# Patient Record
Sex: Female | Born: 1960 | Race: White | Hispanic: No | Marital: Married | Smoking: Never smoker
Health system: Southern US, Community
[De-identification: ages and names within clinical notes are randomized; demographics above are authoritative.]

## PROBLEM LIST (undated history)

## (undated) DIAGNOSIS — I10 Essential (primary) hypertension: Secondary | ICD-10-CM

## (undated) DIAGNOSIS — C8590 Non-Hodgkin lymphoma, unspecified, unspecified site: Secondary | ICD-10-CM

## (undated) HISTORY — PX: SALIVARY GLAND SURGERY: SHX768

## (undated) HISTORY — DX: Non-Hodgkin lymphoma, unspecified, unspecified site: C85.90

## (undated) HISTORY — PX: THYROID SURGERY: SHX805

## (undated) HISTORY — PX: LIMBAL STEM CELL TRANSPLANT: SHX1969

## (undated) HISTORY — DX: Essential (primary) hypertension: I10

---

## 2013-03-31 DIAGNOSIS — C8330 Diffuse large B-cell lymphoma, unspecified site: Secondary | ICD-10-CM | POA: Insufficient documentation

## 2014-09-13 DIAGNOSIS — Z8669 Personal history of other diseases of the nervous system and sense organs: Secondary | ICD-10-CM | POA: Insufficient documentation

## 2016-01-12 DIAGNOSIS — Z9484 Stem cells transplant status: Secondary | ICD-10-CM | POA: Insufficient documentation

## 2017-01-11 DIAGNOSIS — Z9189 Other specified personal risk factors, not elsewhere classified: Secondary | ICD-10-CM | POA: Insufficient documentation

## 2018-03-18 ENCOUNTER — Telehealth: Payer: Self-pay | Admitting: *Deleted

## 2018-03-18 NOTE — Telephone Encounter (Signed)
Left patient a message to call and reschedule appt for 04/07/18 at 7:00am with Dr. Hulan Fray. Dr. Nehemiah Settle will be here in the AM and [facility] in the PM or she can wait to see Dr. Hulan Fray.

## 2018-03-25 ENCOUNTER — Other Ambulatory Visit: Payer: Self-pay | Admitting: Certified Nurse Midwife

## 2018-03-25 ENCOUNTER — Encounter: Payer: Self-pay | Admitting: Certified Nurse Midwife

## 2018-03-25 ENCOUNTER — Ambulatory Visit (INDEPENDENT_AMBULATORY_CARE_PROVIDER_SITE_OTHER): Payer: 59 | Admitting: Certified Nurse Midwife

## 2018-03-25 VITALS — BP 126/78 | HR 84 | Resp 16 | Ht 68.0 in | Wt 171.0 lb

## 2018-03-25 DIAGNOSIS — N898 Other specified noninflammatory disorders of vagina: Secondary | ICD-10-CM

## 2018-03-25 DIAGNOSIS — Z01419 Encounter for gynecological examination (general) (routine) without abnormal findings: Secondary | ICD-10-CM

## 2018-03-25 DIAGNOSIS — Z124 Encounter for screening for malignant neoplasm of cervix: Secondary | ICD-10-CM | POA: Diagnosis not present

## 2018-03-25 DIAGNOSIS — Z1231 Encounter for screening mammogram for malignant neoplasm of breast: Secondary | ICD-10-CM

## 2018-03-25 DIAGNOSIS — Z1151 Encounter for screening for human papillomavirus (HPV): Secondary | ICD-10-CM

## 2018-03-25 MED ORDER — TERCONAZOLE 0.4 % VA CREA: 1.0000 | TOPICAL_CREAM | Freq: Every day | VAGINAL | 0 refills | Status: DC

## 2018-03-26 ENCOUNTER — Emergency Department (INDEPENDENT_AMBULATORY_CARE_PROVIDER_SITE_OTHER): Payer: 59

## 2018-03-26 ENCOUNTER — Emergency Department (INDEPENDENT_AMBULATORY_CARE_PROVIDER_SITE_OTHER)
Admission: EM | Admit: 2018-03-26 | Discharge: 2018-03-26 | Disposition: A | Discharge status: Home / Self Care | Payer: 59 | Source: Home / Self Care | Attending: Family Medicine | Admitting: Family Medicine

## 2018-03-26 ENCOUNTER — Other Ambulatory Visit: Payer: Self-pay

## 2018-03-26 ENCOUNTER — Encounter: Payer: Self-pay | Admitting: *Deleted

## 2018-03-26 ENCOUNTER — Telehealth: Payer: Self-pay | Admitting: *Deleted

## 2018-03-26 DIAGNOSIS — R05 Cough: Secondary | ICD-10-CM

## 2018-03-26 DIAGNOSIS — R509 Fever, unspecified: Secondary | ICD-10-CM | POA: Diagnosis not present

## 2018-03-26 DIAGNOSIS — R69 Illness, unspecified: Secondary | ICD-10-CM

## 2018-03-26 DIAGNOSIS — J111 Influenza due to unidentified influenza virus with other respiratory manifestations: Secondary | ICD-10-CM

## 2018-03-26 LAB — POCT INFLUENZA A/B
INFLUENZA A, POC: NEGATIVE
INFLUENZA B, POC: NEGATIVE

## 2018-03-26 MED ORDER — OSELTAMIVIR PHOSPHATE 75 MG PO CAPS: 75.0000 mg | ORAL_CAPSULE | Freq: Two times a day (BID) | ORAL | 0 refills | Status: DC

## 2018-03-26 NOTE — ED Triage Notes (Signed)
Pt c/o sudden onset fever up to 102.1, cough, sneezing, and sore throat x 1 day. No OTC meds today.

## 2018-03-26 NOTE — Discharge Instructions (Addendum)
Take plain guaifenesin (1200mg  extended release tabs such as Mucinex) twice daily, with plenty of water, for cough and congestion. Get adequate rest.   May use Afrin nasal spray (or generic oxymetazoline) each morning for about 5 days and then discontinue.  Also recommend using saline nasal spray several times daily and saline nasal irrigation (AYR is a common brand).   Try warm salt water gargles for sore throat.  Stop all antihistamines for now, and other non-prescription cough/cold preparations. May take Delsym Cough Suppressant at bedtime for nighttime cough.  May take Ibuprofen 200mg , 4 tabs every 8 hours with food for fever, body aches, headache, etc.

## 2018-03-26 NOTE — ED Provider Notes (Signed)
[facility]    CSN: [id number] Arrival date & time: 03/26/18  0919     History   Chief Complaint Chief Complaint  Patient presents with  . Fever  . Cough    HPI Shelly Haley is a 57 y.o. female.   Patient began sneezing yesterday, followed by nasal congestion, fatigue, mild myalgias, non-productive cough and fever to 102.  This morning her fever was 101 and her symptoms persist. She has a distant past history of pneumonia.  She has history of lymphoma in remission.    The history is provided by the patient.    Past Medical History:  Diagnosis Date  . Hypertension   . Lymphoma ([place])   . Non-Hodgkin lymphoma ([place])     Active problems:  hypertension    Past Surgical History:  Procedure Laterality Date  . LIMBAL STEM CELL TRANSPLANT    . SALIVARY GLAND SURGERY    . THYROID SURGERY      OB History    Gravida  2   Para  2   Term  2   Preterm      AB      Living        SAB      TAB      Ectopic      Multiple      Live Births  2            Home Medications    Prior to Admission medications   Medication Sig Start Date End Date Taking? Authorizing Provider  cholecalciferol (VITAMIN D3) 25 MCG (1000 UT) tablet Take 4,000 Units by mouth daily.    [provider]  metoprolol tartrate (LOPRESSOR) 50 MG tablet Take 50 mg by mouth 2 (two) times daily.    [provider]  oseltamivir (TAMIFLU) 75 MG capsule Take 1 capsule (75 mg total) by mouth every 12 (twelve) hours. 03/26/18   Kandra Nicolas, MD  terconazole (TERAZOL 7) 0.4 % vaginal cream Place 1 applicator vaginally at bedtime. Use for seven days 03/25/18   Lajean Manes, CNM    Family History Family History  Problem Relation Age of Onset  . Kidney cancer Mother   . Esophageal cancer Father   . Heart disease Father     Social History Social History   Tobacco Use  . Smoking status: Never Smoker  . Smokeless tobacco: Never Used  Substance Use  Topics  . Alcohol use: Never    Frequency: Never  . Drug use: Never     Allergies   Patient has no known allergies.   Review of Systems Review of Systems + sore throat + cough + sneezing No pleuritic pain No wheezing + nasal congestion + post-nasal drainage No sinus pain/pressure No itchy/red eyes No earache No hemoptysis No SOB + fever, + chills No nausea No vomiting No abdominal pain No diarrhea No urinary symptoms No skin rash + fatigue + myalgias + headache Used OTC meds without relief   Physical Exam Triage Vital Signs ED Triage Vitals [03/26/18 1018]  Enc Vitals Group     BP (!) 143/80     Pulse Rate (!) 105     Resp 18     Temp 99.1 F (37.3 C)     Temp Source Oral     SpO2 97 %     Weight 166 lb (75.3 kg)     Height 5\' 8"  (1.727 m)     Head Circumference  Peak Flow      Pain Score 0     Pain Loc      Pain Edu?      Excl. in [place]?    No data found.  Updated Vital Signs BP (!) 143/80 (BP Location: Right Arm)   Pulse (!) 105   Temp 99.1 F (37.3 C) (Oral)   Resp 18   Ht 5\' 8"  (1.727 m)   Wt 75.3 kg   SpO2 97%   BMI 25.24 kg/m   Visual Acuity Right Eye Distance:   Left Eye Distance:   Bilateral Distance:    Right Eye Near:   Left Eye Near:    Bilateral Near:     Physical Exam Nursing notes and Vital Signs reviewed. Appearance:  Patient appears stated age, and in no acute distress Eyes:  Pupils are equal, round, and reactive to light and accomodation.  Extraocular movement is intact.  Conjunctivae are not inflamed  Ears:  Canals normal.  Tympanic membranes normal.  Nose:  Mildly congested turbinates.  No sinus tenderness.    Pharynx:  Normal Neck:  Supple.  Mildly enlarged lateral nodes, nontender   Lungs:  Clear to auscultation.  Breath sounds are equal.  Moving air well. Heart:  Regular rate and rhythm without murmurs, rubs, or gallops.  Abdomen:  Nontender without masses or hepatosplenomegaly.  Bowel sounds are present.   No CVA or flank tenderness.  Extremities:  No edema.  Skin:  No rash present.    UC Treatments / Results  Labs (all labs ordered are listed, but only abnormal results are displayed) Labs Reviewed - No data to display  EKG None  Radiology Dg Chest 2 View  Result Date: 03/26/2018 CLINICAL DATA:  Pt c/o flu like symptoms, cough, fever x 2 days, thyroid surgery, HTN, NHL, limbal stem cell transplant EXAM: CHEST - 2 VIEW COMPARISON:  None. FINDINGS: Cardiac silhouette is normal in size. No mediastinal hilar masses. No evidence of adenopathy in ill-defined nodular opacity peripheral left mid lung, which likely just above the oblique fissure in the left upper lobe on the lateral view. This measures approximately 1 cm size. Lungs are otherwise clear.  No pleural effusion pneumothorax. Skeletal structures are intact. IMPRESSION: 1. No acute cardiopulmonary disease. 2. Possible 1 cm left upper lobe nodule. Follow-up nonemergent chest CT recommended for further assessment. Electronically Signed   By: Lajean Manes M.D.   On: 03/26/2018 11:13    Procedures Procedures (including critical care time)  Medications Ordered in UC Medications - No data to display  Initial Impression / Assessment and Plan / UC Course  I have reviewed the triage vital signs and the nursing notes.  Pertinent labs & imaging results that were available during my care of the patient were reviewed by me and considered in my medical decision making (see chart for details).    ?false negative rapid flu test.  Will begin empiricTamiflu.  There is no evidence of bacterial infection today. Chest X-ray today shows 1cm left upper lobe nodule.  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 Recommend follow-up with PCP if not improving about 5 days.  Final Clinical Impressions(s) / UC Diagnoses   Final  diagnoses:  Influenza-like illness     Discharge Instructions     Take plain guaifenesin (1200mg  extended release tabs such as Mucinex) twice daily, with plenty of water,  for cough and congestion. Get adequate rest.   May use Afrin nasal spray (or generic oxymetazoline) each morning for about 5 days and then discontinue.  Also recommend using saline nasal spray several times daily and saline nasal irrigation (AYR is a common brand).   Try warm salt water gargles for sore throat.  Stop all antihistamines for now, and other non-prescription cough/cold preparations. May take Delsym Cough Suppressant at bedtime for nighttime cough.  May take Ibuprofen 200mg , 4 tabs every 8 hours with food for fever, body aches, headache, etc.        ED Prescriptions    Medication Sig Dispense Auth. Provider   oseltamivir (TAMIFLU) 75 MG capsule Take 1 capsule (75 mg total) by mouth every 12 (twelve) hours. 10 capsule Kandra Nicolas, MD         Kandra Nicolas, MD 03/26/18 1146

## 2018-03-26 NOTE — Telephone Encounter (Signed)
Encounter created to enter POCT Flu order and results not entered at visit.

## 2018-03-27 LAB — CYTOLOGY - PAP
Diagnosis: NEGATIVE
HPV: NOT DETECTED

## 2018-03-27 NOTE — Progress Notes (Signed)
GYNECOLOGY ANNUAL PREVENTATIVE CARE ENCOUNTER NOTE  Subjective:   Shelly Haley is a 57 y.o. G22P2000 female here for a routine annual gynecologic exam.  Current complaints: vaginal itching and discharge.   Denies abnormal vaginal bleeding, pelvic pain, problems with intercourse or other gynecologic concerns.    Gynecologic History No LMP recorded. Patient is postmenopausal. Contraception: none  Obstetric History OB History  Gravida Para Term Preterm AB Living  2 2 2         SAB TAB Ectopic Multiple Live Births          2    # Outcome Date GA Lbr Len/2nd Weight Sex Delivery Anes PTL Lv  2 Term      Vag-Spont     1 Term      Vag-Spont       Past Medical History:  Diagnosis Date  . Hypertension   . Lymphoma ([place])   . Non-Hodgkin lymphoma [facility])     Past Surgical History:  Procedure Laterality Date  . LIMBAL STEM CELL TRANSPLANT    . SALIVARY GLAND SURGERY    . THYROID SURGERY      Current Outpatient Medications on File Prior to Visit  Medication Sig Dispense Refill  . cholecalciferol (VITAMIN D3) 25 MCG (1000 UT) tablet Take 4,000 Units by mouth daily.    . metoprolol tartrate (LOPRESSOR) 50 MG tablet Take 50 mg by mouth 2 (two) times daily.     No current facility-administered medications on file prior to visit.     No Known Allergies  Social History:  reports that she has never smoked. She has never used smokeless tobacco. She reports that she does not drink alcohol or use drugs.  Family History  Problem Relation Age of Onset  . Kidney cancer Mother   . Esophageal cancer Father   . Heart disease Father     The following portions of the patient's history were reviewed and updated as appropriate: allergies, current medications, past family history, past medical history, past social history, past surgical history and problem list.  Review of Systems Pertinent items noted in HPI and remainder of comprehensive ROS otherwise negative.   Objective:  BP 126/78    Pulse 84   Resp 16   Ht 5\' 8"  (1.727 m)   Wt 171 lb (77.6 kg)   BMI 26.00 kg/m  CONSTITUTIONAL: Well-developed, well-nourished female in no acute distress.  HENT:  Normocephalic, atraumatic, External right and left ear normal. Oropharynx is clear and moist EYES: Conjunctivae and EOM are normal. Pupils are equal, round, and reactive to light. No scleral icterus.  NECK: Normal range of motion, supple, no masses.  Normal thyroid.  SKIN: Skin is warm and dry. No rash noted. Not diaphoretic. No erythema. No pallor. MUSCULOSKELETAL: Normal range of motion. No tenderness.  No cyanosis, clubbing, or edema.  2+ distal pulses. NEUROLOGIC: Alert and oriented to person, place, and time. Normal reflexes, muscle tone coordination. No cranial nerve deficit noted. PSYCHIATRIC: Normal mood and affect. Normal behavior. Normal judgment and thought content. CARDIOVASCULAR: Normal heart rate noted, regular rhythm RESPIRATORY: Clear to auscultation bilaterally. Effort and breath sounds normal, no problems with respiration noted. BREASTS: Symmetric in size. No masses, skin changes, nipple drainage, or lymphadenopathy. ABDOMEN: Soft, normal bowel sounds, no distention noted.  No tenderness, rebound or guarding.  PELVIC: Normal appearing external genitalia; normal appearing vaginal mucosa and cervix.  White curdy discharge noted around cervix.  Pap smear obtained.  Normal uterine size, no other palpable  masses, no uterine or adnexal tenderness.    Assessment and Plan:  1. Well woman exam - Normal well woman examination, referral placed for colonoscopy  - Cytology - PAP( [place])  2. Vaginal discharge - White curdy discharge with itching, Rx sent for yeast infection  - terconazole (TERAZOL 7) 0.4 % vaginal cream; Place 1 applicator vaginally at bedtime. Use for seven days  Dispense: 45 g; Refill: 0  3. Vaginal itching - terconazole (TERAZOL 7) 0.4 % vaginal cream; Place 1 applicator vaginally at  bedtime. Use for seven days  Dispense: 45 g; Refill: 0  Will follow up results of pap smear and manage accordingly. Mammogram scheduled Routine preventative health maintenance measures emphasized. Please refer to After Visit Summary for other counseling recommendations.    Lajean Manes, [place] for [employer], [place]

## 2018-03-28 ENCOUNTER — Telehealth: Payer: Self-pay | Admitting: *Deleted

## 2018-03-28 MED ORDER — FLUCONAZOLE 150 MG PO TABS: 150.0000 mg | ORAL_TABLET | Freq: Once | ORAL | 0 refills | Status: AC

## 2018-03-28 NOTE — Telephone Encounter (Signed)
Pt called stating that she would like Diflucan instead of the Terazol she was given.  RX for Diflucan sent to [facility].

## 2018-04-07 ENCOUNTER — Encounter: Payer: Self-pay | Admitting: Obstetrics & Gynecology

## 2018-04-08 ENCOUNTER — Encounter: Payer: Self-pay | Admitting: Advanced Practice Midwife

## 2018-04-09 ENCOUNTER — Ambulatory Visit: Payer: 59

## 2018-04-23 ENCOUNTER — Ambulatory Visit (INDEPENDENT_AMBULATORY_CARE_PROVIDER_SITE_OTHER): Payer: 59

## 2018-04-23 DIAGNOSIS — Z1231 Encounter for screening mammogram for malignant neoplasm of breast: Secondary | ICD-10-CM | POA: Diagnosis not present

## 2019-04-13 ENCOUNTER — Encounter

## 2019-06-15 ENCOUNTER — Other Ambulatory Visit: Payer: Self-pay | Admitting: Certified Nurse Midwife

## 2019-06-15 DIAGNOSIS — Z1231 Encounter for screening mammogram for malignant neoplasm of breast: Secondary | ICD-10-CM

## 2019-07-08 ENCOUNTER — Other Ambulatory Visit: Payer: Self-pay

## 2019-07-08 ENCOUNTER — Ambulatory Visit (INDEPENDENT_AMBULATORY_CARE_PROVIDER_SITE_OTHER): Payer: 59

## 2019-07-08 DIAGNOSIS — Z1231 Encounter for screening mammogram for malignant neoplasm of breast: Secondary | ICD-10-CM

## 2019-08-11 ENCOUNTER — Ambulatory Visit (INDEPENDENT_AMBULATORY_CARE_PROVIDER_SITE_OTHER): Payer: 59 | Admitting: Certified Nurse Midwife

## 2019-08-11 ENCOUNTER — Other Ambulatory Visit: Payer: Self-pay

## 2019-08-11 ENCOUNTER — Encounter: Payer: Self-pay | Admitting: Certified Nurse Midwife

## 2019-08-11 ENCOUNTER — Other Ambulatory Visit (HOSPITAL_COMMUNITY)
Admission: RE | Admit: 2019-08-11 | Discharge: 2019-08-11 | Disposition: A | Discharge status: Home / Self Care | Payer: 59 | Source: Ambulatory Visit | Attending: Certified Nurse Midwife | Admitting: Certified Nurse Midwife

## 2019-08-11 VITALS — BP 129/79 | HR 82 | Temp 98.0°F | Resp 16 | Ht 68.0 in | Wt 166.0 lb

## 2019-08-11 DIAGNOSIS — Z01419 Encounter for gynecological examination (general) (routine) without abnormal findings: Secondary | ICD-10-CM | POA: Insufficient documentation

## 2019-08-11 NOTE — Progress Notes (Signed)
GYNECOLOGY ANNUAL PREVENTATIVE CARE ENCOUNTER NOTE  History:     Shelly Haley is a 59 y.o. G88P2000 female here for a routine annual gynecologic exam.  Current complaints: vaginal dryness and irritation.   Denies abnormal vaginal bleeding, discharge, pelvic pain, or other gynecologic concerns. patient request pap smear today.    Gynecologic History No LMP recorded. Patient is postmenopausal. Contraception: none Last Pap: 03/2018. Results were: normal with negative HPV Last mammogram: 06/2019. Results were: normal  Obstetric History OB History  Gravida Para Term Preterm AB Living  2 2 2         SAB TAB Ectopic Multiple Live Births          2    # Outcome Date GA Lbr Len/2nd Weight Sex Delivery Anes PTL Lv  2 Term      Vag-Spont     1 Term      Vag-Spont       Past Medical History:  Diagnosis Date  . Hypertension   . Lymphoma ([place])   . Non-Hodgkin lymphoma [facility])     Past Surgical History:  Procedure Laterality Date  . LIMBAL STEM CELL TRANSPLANT    . SALIVARY GLAND SURGERY    . THYROID SURGERY      Current Outpatient Medications on File Prior to Visit  Medication Sig Dispense Refill  . cholecalciferol (VITAMIN D3) 25 MCG (1000 UT) tablet Take 4,000 Units by mouth daily.    . metoprolol tartrate (LOPRESSOR) 50 MG tablet Take 50 mg by mouth 2 (two) times daily.     No current facility-administered medications on file prior to visit.    No Known Allergies  Social History:  reports that she has never smoked. She has never used smokeless tobacco. She reports that she does not drink alcohol or use drugs.  Family History  Problem Relation Age of Onset  . Kidney cancer Mother   . Esophageal cancer Father   . Heart disease Father     The following portions of the patient's history were reviewed and updated as appropriate: allergies, current medications, past family history, past medical history, past social history, past surgical history and problem list.  Review  of Systems Pertinent items noted in HPI and remainder of comprehensive ROS otherwise negative.  Physical Exam:  BP 129/79   Pulse 82   Temp 98 F (36.7 C)   Resp 16   Ht 5\' 8"  (1.727 m)   Wt 166 lb (75.3 kg)   BMI 25.24 kg/m  CONSTITUTIONAL: Well-developed, well-nourished female in no acute distress.  HENT:  Normocephalic, atraumatic, External right and left ear normal. Oropharynx is clear and moist EYES: Conjunctivae and EOM are normal. Pupils are equal, round, and reactive to light. NECK: Normal range of motion, supple, no masses.  Normal thyroid.  SKIN: Skin is warm and dry. No rash noted. Not diaphoretic. No erythema. No pallor. MUSCULOSKELETAL: Normal range of motion. No tenderness.  No cyanosis, clubbing, or edema.  2+ distal pulses. NEUROLOGIC: Alert and oriented to person, place, and time. Normal reflexes, muscle tone coordination.  PSYCHIATRIC: Normal mood and affect. Normal behavior. Normal judgment and thought content. CARDIOVASCULAR: Normal heart rate noted, regular rhythm RESPIRATORY: Clear to auscultation bilaterally. Effort and breath sounds normal, no problems with respiration noted. BREASTS: Symmetric in size. No masses, tenderness, skin changes, nipple drainage, or lymphadenopathy bilaterally. Performed in the presence of a chaperone. ABDOMEN: Soft, no distention noted.  No tenderness, rebound or guarding.  PELVIC: external genitalia and urethral  meatus dry, cracked; vaginal mucosa atrophic and dry. Normal appearing cervix.  No abnormal discharge noted.  Pap smear obtained.  Normal uterine size, no other palpable masses, no uterine or adnexal tenderness.  Performed in the presence of a chaperone.   Assessment and Plan:    1. Well woman exam with routine gynecological exam - Vaginal atrophy present, Replens vaginal insert and external gel samples given to patient  - Discussed with patient use of Replens, if she likes and it helps then can call to get Rx for it.  -  Discussed with patient d/t hx of cancer would not recommend estrogen for localized effects of menopause, patient agrees that she does not want estrogen d/t her hx  - Patient request pap smear  - Cytology - PAP( [place]) - Ambulatory referral to Gastroenterology   Will follow up results of pap smear and manage accordingly. Mammogram completed in February- negative Patient needs colonoscopy scheduled  Routine preventative health maintenance measures emphasized. Please refer to After Visit Summary for other counseling recommendations.      Shelly Haley, [place] for [employer], [place]

## 2019-08-12 LAB — CYTOLOGY - PAP
Comment: NEGATIVE
Diagnosis: REACTIVE
High risk HPV: NEGATIVE

## 2020-08-23 ENCOUNTER — Other Ambulatory Visit: Payer: Self-pay

## 2020-08-23 DIAGNOSIS — Z1231 Encounter for screening mammogram for malignant neoplasm of breast: Secondary | ICD-10-CM

## 2020-09-07 ENCOUNTER — Other Ambulatory Visit: Payer: Self-pay

## 2020-09-07 ENCOUNTER — Ambulatory Visit: Payer: 59

## 2020-09-07 DIAGNOSIS — Z1231 Encounter for screening mammogram for malignant neoplasm of breast: Secondary | ICD-10-CM

## 2020-10-06 ENCOUNTER — Other Ambulatory Visit (HOSPITAL_COMMUNITY)
Admission: RE | Admit: 2020-10-06 | Discharge: 2020-10-06 | Disposition: A | Discharge status: Home / Self Care | Payer: 59 | Source: Ambulatory Visit | Attending: Obstetrics and Gynecology | Admitting: Obstetrics and Gynecology

## 2020-10-06 ENCOUNTER — Encounter: Payer: Self-pay | Admitting: Obstetrics and Gynecology

## 2020-10-06 ENCOUNTER — Ambulatory Visit (INDEPENDENT_AMBULATORY_CARE_PROVIDER_SITE_OTHER): Payer: 59 | Admitting: Obstetrics and Gynecology

## 2020-10-06 ENCOUNTER — Other Ambulatory Visit: Payer: Self-pay

## 2020-10-06 VITALS — BP 154/89 | HR 79 | Ht 68.0 in | Wt 163.0 lb

## 2020-10-06 DIAGNOSIS — Z01419 Encounter for gynecological examination (general) (routine) without abnormal findings: Secondary | ICD-10-CM

## 2020-10-06 MED ORDER — NYSTATIN-TRIAMCINOLONE 100000-0.1 UNIT/GM-% EX OINT: 1.0000 "application " | TOPICAL_OINTMENT | Freq: Two times a day (BID) | CUTANEOUS | 3 refills | Status: DC

## 2020-10-06 NOTE — Progress Notes (Signed)
Subjective:     Shelly Haley is a 60 y.o. female P2 postmenopausal who is here for a comprehensive physical exam. The patient reports vaginal dryness. She is not sexually active. She denies any episodes of postmenopausal vaginal bleeding. She reports the presence of vaginal dryness for several years. She is not interested in HRT. She was prescribed a cream by her oncologist which has helped significantly but she reports intermittent vulva pruritis. Patient is without any other complaints  Past Medical History:  Diagnosis Date  . Hypertension   . Lymphoma ([place])   . Non-Hodgkin lymphoma [facility])    Past Surgical History:  Procedure Laterality Date  . LIMBAL STEM CELL TRANSPLANT    . SALIVARY GLAND SURGERY    . THYROID SURGERY     Family History  Problem Relation Age of Onset  . Kidney cancer Mother   . Esophageal cancer Father   . Heart disease Father     Social History   Socioeconomic History  . Marital status: Married    Spouse name: Not on file  . Number of children: Not on file  . Years of education: Not on file  . Highest education level: Not on file  Occupational History  . Occupation: accounts receivable  Tobacco Use  . Smoking status: Never Smoker  . Smokeless tobacco: Never Used  Vaping Use  . Vaping Use: Never used  Substance and Sexual Activity  . Alcohol use: Never  . Drug use: Never  . Sexual activity: Not Currently    Partners: Male    Birth control/protection: None  Other Topics Concern  . Not on file  Social History Narrative  . Not on file   Social Determinants of Health   Financial Resource Strain: Not on file  Food Insecurity: Not on file  Transportation Needs: Not on file  Physical Activity: Not on file  Stress: Not on file  Social Connections: Not on file  Intimate Partner Violence: Not on file   Health Maintenance  Topic Date Due  . COVID-19 Vaccine (1) Never done  . HIV Screening  Never done  . Hepatitis C Screening  Never done  .  COLONOSCOPY (Pts 45-57yrs Insurance coverage will need to be confirmed)  Never done  . INFLUENZA VACCINE  12/12/2020  . TETANUS/TDAP  04/02/2021  . PAP SMEAR-Modifier  08/11/2022  . MAMMOGRAM  09/08/2022  . HPV VACCINES  Aged Out       Review of Systems Pertinent items noted in HPI and remainder of comprehensive ROS otherwise negative.   Objective:  Blood pressure (!) 154/89, pulse 79, height 5\' 8"  (1.727 m), weight 163 lb (73.9 kg).     GENERAL: Well-developed, well-nourished female in no acute distress.  HEENT: Normocephalic, atraumatic. Sclerae anicteric.  NECK: Supple. Normal thyroid.  LUNGS: Clear to auscultation bilaterally.  HEART: Regular rate and rhythm. BREASTS: Symmetric in size. No palpable masses or lymphadenopathy, skin changes, or nipple drainage. ABDOMEN: Soft, nontender, nondistended. No organomegaly. PELVIC: Normal external female genitalia with erythema on bilateral labia majora with areas of excoriation. Vagina is pale and atrophic.  Normal discharge. Normal appearing cervix. Uterus is normal in size.  No adnexal mass or tenderness. EXTREMITIES: No cyanosis, clubbing, or edema, 2+ distal pulses.    Assessment:    Healthy female exam.      Plan:    Pap smear collected Patient with normal mammogram 08/2020 Patient had a colonoscopy in 2016 with [facility] Rx nystatin provided Patient will be contacted with abnormal results  See After Visit Summary for Counseling Recommendations

## 2020-10-11 LAB — CYTOLOGY - PAP

## 2021-05-02 ENCOUNTER — Other Ambulatory Visit: Payer: Self-pay

## 2021-05-02 ENCOUNTER — Other Ambulatory Visit (HOSPITAL_COMMUNITY)
Admission: RE | Admit: 2021-05-02 | Discharge: 2021-05-02 | Disposition: A | Discharge status: Home / Self Care | Payer: 59 | Source: Ambulatory Visit

## 2021-05-02 ENCOUNTER — Ambulatory Visit (INDEPENDENT_AMBULATORY_CARE_PROVIDER_SITE_OTHER): Payer: 59

## 2021-05-02 VITALS — BP 124/75 | HR 69 | Resp 16 | Ht 68.0 in | Wt 155.0 lb

## 2021-05-02 DIAGNOSIS — N898 Other specified noninflammatory disorders of vagina: Secondary | ICD-10-CM

## 2021-05-02 DIAGNOSIS — B379 Candidiasis, unspecified: Secondary | ICD-10-CM | POA: Diagnosis not present

## 2021-05-02 MED ORDER — FLUCONAZOLE 150 MG PO TABS: 150.0000 mg | ORAL_TABLET | Freq: Once | ORAL | 1 refills | Status: AC

## 2021-05-02 NOTE — Progress Notes (Signed)
° °  GYNECOLOGY PROGRESS NOTE  History:  60 y.o. G2P2000 presents to [place] office today for problem gyn visit. She reports vaginal itching and discharge. Patient is s/p chemo x3 years and was told yeast infections and vaginal dryness are common. This is an ongoing issue. Patient prone to yeast infections. She is using Vagifem that was prescribed by her oncologist which helps with vaginal dryness. Was previously prescribed Nystatin for vaginal irritation/itching, which helped somewhat initially, however is no longer working. Reports itching is constant, unable to get any relief. She denies h/a, dizziness, shortness of breath, n/v, or fever/chills.    The following portions of the patient's history were reviewed and updated as appropriate: allergies, current medications, past family history, past medical history, past social history, past surgical history and problem list. Last pap smear on 10/06/2020 [facility]. Patient has not had follow up colposcopy.  Health Maintenance Due  Topic Date Due   COVID-19 Vaccine (1) Never done   Pneumococcal Vaccine 57-90 Years old (1 - PCV) Never done   HIV Screening  Never done   Hepatitis C Screening  Never done   Zoster Vaccines- Shingrix (1 of 2) Never done   INFLUENZA VACCINE  12/12/2020   TETANUS/TDAP  04/02/2021     Review of Systems:  Pertinent items are noted in HPI.   Objective:  Physical Exam Blood pressure 124/75, pulse 69, resp. rate 16, height 5\' 8"  (1.727 m), weight 155 lb (70.3 kg). VS reviewed, nursing note reviewed,  Constitutional: well developed, well nourished, no distress HEENT: normocephalic CV: normal rate Pulm/chest wall: normal effort Breast Exam: deferred Abdomen: soft, non-tender Neuro: alert and oriented x 3 Skin: warm, dry Psych: affect normal Pelvic exam: External genitalia erythematous and swollen extending down perineum, bilateral areas of excoriation, copious amounts of thick, white discharge at introitus, exam difficult  d/t patient discomfort, given patient discomfort blind swabs obtained   Assessment & Plan:  1. Vagina itching  - Cervicovaginal ancillary only( [place]) - Fungus Culture & Smear  2. Yeast infection - Exam difficult given patient discomfort, copious amounts of thick white vaginal discharge at introitus. Will culture and treat for yeast infection at this time.  - Patient will follow up in 1 week for colpo and can reassess then if needed  - fluconazole (DIFLUCAN) 150 MG tablet; Take 1 tablet (150 mg total) by mouth once for 1 dose. Can take additional dose three days later if symptoms persist  Dispense: 1 tablet; Refill: 1 - Fungus Culture & Smear   Follow up 1 week for colposcopy   Renee Harder, CNM 05/02/21 12:15 PM

## 2021-05-03 LAB — CERVICOVAGINAL ANCILLARY ONLY
Bacterial Vaginitis (gardnerella): NEGATIVE
Candida Glabrata: NEGATIVE
Candida Vaginitis: NEGATIVE
Comment: NEGATIVE
Comment: NEGATIVE
Comment: NEGATIVE

## 2021-05-10 NOTE — Progress Notes (Signed)
GYNECOLOGY OFFICE VISIT NOTE  History:   Shelly Haley is a 60 y.o. G2P2000 here today originally just for colposcopy but prior to colposcopy we discussed her vulvar irritation which has been going on for about 3 years. It was worse before but she has had intermittent relief with a variety of topical therapies - she has done vaginal estrogen for some time. She has tried nystatin/triamcinolone with some relief.  She had cultures about one week ago and they were negative for BV and yeast.  The vulvar irritation is itching and burning and she will also get a white-ish discharge. The area is so friable that often times speculum exams cause bleeding from breaks in her skin. She is unable to be sexually active. She doesn't go in pools any more due to it although she wishes she could.     Past Medical History:  Diagnosis Date   Hypertension    Lymphoma ([place])    Non-Hodgkin lymphoma ([place])     Past Surgical History:  Procedure Laterality Date   LIMBAL STEM CELL TRANSPLANT     SALIVARY GLAND SURGERY     THYROID SURGERY      The following portions of the patient's history were reviewed and updated as appropriate: allergies, current medications, past family history, past medical history, past social history, past surgical history and problem list.   Health Maintenance:   Diagnosis  Date Value Ref Range Status  10/06/2020 - Low grade squamous intraepithelial lesion (LSIL) (A)      Review of Systems:  Pertinent items noted in HPI and remainder of comprehensive ROS otherwise negative.  Physical Exam:  BP (!) 148/93    Pulse 72    Ht 5\' 8"  (1.727 m)    Wt 156 lb (70.8 kg)    BMI 23.72 kg/m  CONSTITUTIONAL: Well-developed, well-nourished female in no acute distress.  HEENT:  Normocephalic, atraumatic. External right and left ear normal. No scleral icterus.  NECK: Normal range of motion, supple, no masses noted on observation SKIN: No rash noted. Not diaphoretic. No erythema. No  pallor. MUSCULOSKELETAL: Normal range of motion. No edema noted. NEUROLOGIC: Alert and oriented to person, place, and time. Normal muscle tone coordination. No cranial nerve deficit noted. PSYCHIATRIC: Normal mood and affect. Normal behavior. Normal judgment and thought content.  CARDIOVASCULAR: Normal heart rate noted RESPIRATORY: Effort and breath sounds normal, no problems with respiration noted ABDOMEN: No masses noted. No other overt distention noted.    PELVIC: external genitalia was diffusely erythematous with some thickening of the skin over the labia majora. Erythema extended to the perineum and was equal bilaterally without skip lesions, labia minora were swollen, and the area was diffusely tender; normal urethral meatus; erythematous appearing vaginal mucosa  that was diffuse and the cervix had the appearance similar to a "strawberry cervix" although less speckled and larger spots. Performed in the presence of a chaperone   GYNECOLOGY OFFICE COLPOSCOPY PROCEDURE NOTE  60 y.o. G2P2000 here for colposcopy for low-grade squamous intraepithelial neoplasia (LGSIL - encompassing HPV,mild dysplasia,CIN I) pap smear on 09/2020.   Pregnancy test:  not indicated  Informed consent and review of risks, benefit and alternatives performed. Written consent given.   Speculum inserted into patient's vagina assuring full view of cervix and vaginal walls. 3 swabs of vinegar solution applied to the cervix and vaginal walls and colposcope was used to observe both the cervix and vaginal walls.   Colposcopy adequate? Yes  no mosaicism, no punctation, no abnormal vasculature, and  visible lesion(s) on the entirety of the cervix almost consistent with a strawberry cervix - the cervix did not change in color from the vinegar. Prior to vinegar application, the cervix was pink with red spots diffusely over the cervix; Random biopsy done at 1 oclock to evaluate and ECC collected.  All specimens were labeled and  sent to pathology.  Monsel's applied to biopsy sites for good hemostasis and speculum removed.  Pt tolerated well with minimal pain and bleeding.   Patient was given post procedure instructions.  Will follow up pathology and manage accordingly; patient will be contacted with results and recommendations.  Routine preventative health maintenance measures emphasized.  VULVAR BIOPSY NOTE The indications for vulvar biopsy  (refractory to therapy, rule out malignancy, lichen sclerosus, lichen planus)  were reviewed.   Risks of the biopsy including pain, bleeding, infection, inadequate specimen, scarring and need for additional procedures  were discussed. The patient stated understanding and agreed to undergo procedure today. Consent was signed,  time out performed.   The patient's vulva was prepped with Betadine. 1% lidocaine was injected into the right labia. A 4-mm punch biopsy was done, biopsy tissue was picked up with sterile forceps and sterile scissors were used to excise the lesion.  Small bleeding was noted and hemostasis was achieved using silver nitrate sticks.  The patient tolerated the procedure well.   Post-procedure instructions (pelvic rest for one week) were given to the patient. The patient is to call with heavy bleeding, fever greater than 100.4, foul smelling vaginal discharge or other concerns. The patient will be return to clinic in two weeks for discussion of results.    Assessment and Plan:    1. LGSIL on Pap smear of cervix - No colposcopic findings c/f dysplasia although other findings concerning regarding her vulvovaginal area and cervix. 1 oclock biopsy done of her cervix and ECC as well.  - I suspect LSIL is due to the inflammatory process which is significant. No HPV testing done on this. Would advise cotesting with next pap smear in April/May.   2. Vulvar irritation - She has already tried vaginal estrogen (does not appear c/w atrophy) and mycolog with minimal  improvement.  - She has had cultures which have been negative. I did trich cx today due appearance of her cervix but my clinical suspicion is low.  - Given the above I recommended a biopsy and she verbally consented.  - No empiric therapy at this time until we determine cause, if possible   Routine preventative health maintenance measures emphasized. Please refer to After Visit Summary for other counseling recommendations.   No follow-ups on file.  Radene Gunning, MD, [place] for [facility], [place]

## 2021-05-11 ENCOUNTER — Other Ambulatory Visit (HOSPITAL_COMMUNITY)
Admission: RE | Admit: 2021-05-11 | Discharge: 2021-05-11 | Disposition: A | Discharge status: Home / Self Care | Payer: 59 | Source: Ambulatory Visit | Attending: Obstetrics and Gynecology | Admitting: Obstetrics and Gynecology

## 2021-05-11 ENCOUNTER — Other Ambulatory Visit: Payer: Self-pay

## 2021-05-11 ENCOUNTER — Encounter: Payer: Self-pay | Admitting: Obstetrics and Gynecology

## 2021-05-11 ENCOUNTER — Ambulatory Visit (INDEPENDENT_AMBULATORY_CARE_PROVIDER_SITE_OTHER): Payer: 59 | Admitting: Obstetrics and Gynecology

## 2021-05-11 VITALS — BP 148/93 | HR 72 | Ht 68.0 in | Wt 156.0 lb

## 2021-05-11 DIAGNOSIS — N9089 Other specified noninflammatory disorders of vulva and perineum: Secondary | ICD-10-CM

## 2021-05-11 DIAGNOSIS — R87612 Low grade squamous intraepithelial lesion on cytologic smear of cervix (LGSIL): Secondary | ICD-10-CM | POA: Insufficient documentation

## 2021-05-16 LAB — CERVICOVAGINAL ANCILLARY ONLY
Comment: NEGATIVE
Trichomonas: NEGATIVE

## 2021-05-18 LAB — SURGICAL PATHOLOGY

## 2021-05-23 MED ORDER — AZITHROMYCIN 500 MG PO TABS: 1000.0000 mg | ORAL_TABLET | Freq: Once | ORAL | 0 refills | Status: AC

## 2021-05-23 MED ORDER — METRONIDAZOLE 500 MG PO TABS: ORAL_TABLET | ORAL | 0 refills | Status: DC

## 2021-05-23 MED ORDER — CLOBETASOL PROPIONATE 0.05 % EX OINT
1.0000 | TOPICAL_OINTMENT | Freq: Two times a day (BID) | CUTANEOUS | 2 refills | Status: DC
Start: 2021-05-23 — End: 2022-07-26

## 2021-05-23 MED ORDER — CLOBETASOL PROPIONATE 0.05 % EX OINT
1.0000 | TOPICAL_OINTMENT | Freq: Two times a day (BID) | CUTANEOUS | 2 refills | Status: DC
Start: 2021-05-23 — End: 2021-05-23

## 2021-05-23 NOTE — Addendum Note (Signed)
Addended by: Radene Gunning A on: 05/23/2021 05:35 PM   Modules accepted: Orders

## 2021-05-31 ENCOUNTER — Telehealth: Payer: Self-pay | Admitting: *Deleted

## 2021-05-31 LAB — FUNGUS CULTURE W SMEAR
CULTURE:: NO GROWTH
MICRO NUMBER:: 12783881
SMEAR:: NONE SEEN
SPECIMEN QUALITY:: ADEQUATE

## 2021-05-31 NOTE — Telephone Encounter (Signed)
Left patient a message to call and schedule 8 week F/U appointment with Dr. Damita Dunnings for 07/20/2021.

## 2021-05-31 NOTE — Telephone Encounter (Signed)
-----   Message from Radene Gunning, MD sent at 05/23/2021  5:35 PM EST ----- Can you schedule her a f/u appt with me for 8 weeks from now.

## 2021-07-17 NOTE — Progress Notes (Deleted)
? ?  GYNECOLOGY OFFICE VISIT NOTE ? ?History:  ? Shelly Haley is a 61 y.o. G2P2000 here today for follow up for vulvar irritation.  ? ?- She has already tried vaginal estrogen (does not appear c/w atrophy) and mycolog with minimal improvement.  ? ?- She was given Flagyl and azithromycin and then instructed to start clobetasol given the severity.  ? ? ?  ?Past Medical History:  ?Diagnosis Date  ? Hypertension   ? Lymphoma ([place])   ? Non-Hodgkin lymphoma ([place])   ? ? ?Past Surgical History:  ?Procedure Laterality Date  ? LIMBAL STEM CELL TRANSPLANT    ? SALIVARY GLAND SURGERY    ? THYROID SURGERY    ? ? ?The following portions of the patient's history were reviewed and updated as appropriate: allergies, current medications, past family history, past medical history, past social history, past surgical history and problem list.  ? ?Health Maintenance:   ? ?Diagnosis  ?Date Value Ref Range Status  ?10/06/2020 - Low grade squamous intraepithelial lesion (LSIL) (A)    ?  ? ?Normal mammogram on 09/07/2020.  ? ?Review of Systems:  ?Pertinent items noted in HPI and remainder of comprehensive ROS otherwise negative. ? ?Physical Exam:  ?There were no vitals taken for this visit. ?CONSTITUTIONAL: Well-developed, well-nourished female in no acute distress.  ?HEENT:  Normocephalic, atraumatic. External right and left ear normal. No scleral icterus.  ?NECK: Normal range of motion, supple, no masses noted on observation ?SKIN: No rash noted. Not diaphoretic. No erythema. No pallor. ?MUSCULOSKELETAL: Normal range of motion. No edema noted. ?NEUROLOGIC: Alert and oriented to person, place, and time. Normal muscle tone coordination. No cranial nerve deficit noted. ?PSYCHIATRIC: Normal mood and affect. Normal behavior. Normal judgment and thought content. ? ?CARDIOVASCULAR: Normal heart rate noted ?RESPIRATORY: Effort and breath sounds normal, no problems with respiration noted ?ABDOMEN: No masses noted. No other overt distention noted.    ? ?PELVIC: {Blank single:19197::"Deferred","Normal appearing external genitalia; normal urethral meatus; normal appearing vaginal mucosa and cervix.  No abnormal discharge noted.  Normal uterine size, no other palpable masses, no uterine or adnexal tenderness. Performed in the presence of a chaperone"} ? ?Labs and Imaging ?Vulvar Biopsy 12/29: ?C. LABIA MAJORA, RIGHT INNER, BIOPSY:  ?-  Lichenoid and spongiotic inflammation  ?-  No dysplasia or malignancy identified  ?-  See comment  ? ?Assessment and Plan:  ? 1. Vulvar irritation ?- *** ?- She has had cultures which have been negative. Trich also negative ?- Biopsy negative for malignancy ? ?2. 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 ? ?Routine preventative health maintenance measures emphasized. ?Please refer to After Visit Summary for other counseling recommendations.  ? ?No follow-ups on file. ? ?Radene Gunning, MD, FACOG ?Obstetrician Social research officer, government, Faculty Practice ?[facility] [place] ? ? ? ? ? ?

## 2021-07-20 ENCOUNTER — Ambulatory Visit: Payer: 59 | Admitting: Obstetrics and Gynecology

## 2021-07-20 DIAGNOSIS — N9089 Other specified noninflammatory disorders of vulva and perineum: Secondary | ICD-10-CM

## 2021-07-30 DIAGNOSIS — C8190 Hodgkin lymphoma, unspecified, unspecified site: Secondary | ICD-10-CM | POA: Insufficient documentation

## 2021-07-30 DIAGNOSIS — I10 Essential (primary) hypertension: Secondary | ICD-10-CM | POA: Insufficient documentation

## 2021-07-30 NOTE — Progress Notes (Deleted)
? ?  GYNECOLOGY OFFICE VISIT NOTE ? ?History:  ? Shelly Haley is a 61 y.o. G2P2000 here today for follow up for vulvar irritation. It was red and inflamed and I did a biopsy of her vulva at her colposcopy which did not show cancer or vulvar dysplasia - it reported lichenoid and spongiotic inflammation.  She had a strawberrry cervix but was negative for trichomonas. All of her cultures have been repeatedly negative.  Nevertheless, I did treat her with flagyl in addition to starting her on clobetasol.  ? ?Historical:  ?It has been going on for about 3 years. It was worse before but she has had intermittent relief with a variety of topical therapies - she has done vaginal estrogen for some time. She has tried nystatin/triamcinolone with some relief.  She had cultures about one week ago and they were negative for BV and yeast.  The vulvar irritation is itching and burning and she will also get a white-ish discharge. The area is so friable that often times speculum exams cause bleeding from breaks in her skin. She is unable to be sexually active. She doesn't go in pools any more due to it although she wishes she could.  ?  ?Past Medical History:  ?Diagnosis Date  ? Hypertension   ? Lymphoma ([place])   ? Non-Hodgkin lymphoma ([place])   ? ? ?Past Surgical History:  ?Procedure Laterality Date  ? LIMBAL STEM CELL TRANSPLANT    ? SALIVARY GLAND SURGERY    ? THYROID SURGERY    ? ? ?The following portions of the patient's history were reviewed and updated as appropriate: allergies, current medications, past family history, past medical history, past social history, past surgical history and problem list.  ? ?Health Maintenance:   ?Normal mammogram on 09/07/2020.  ? ?Review of Systems:  ?Pertinent items noted in HPI and remainder of comprehensive ROS otherwise negative. ? ?Physical Exam:  ?There were no vitals taken for this visit. ?CONSTITUTIONAL: Well-developed, well-nourished female in no acute distress.  ?HEENT:  Normocephalic,  atraumatic. External right and left ear normal. No scleral icterus.  ?NECK: Normal range of motion, supple, no masses noted on observation ?SKIN: No rash noted. Not diaphoretic. No erythema. No pallor. ?MUSCULOSKELETAL: Normal range of motion. No edema noted. ?NEUROLOGIC: Alert and oriented to person, place, and time. Normal muscle tone coordination. No cranial nerve deficit noted. ?PSYCHIATRIC: Normal mood and affect. Normal behavior. Normal judgment and thought content. ? ?CARDIOVASCULAR: Normal heart rate noted ?RESPIRATORY: Effort and breath sounds normal, no problems with respiration noted ?ABDOMEN: No masses noted. No other overt distention noted.   ? ?PELVIC: {Blank single:19197::"Deferred","Normal appearing external genitalia; normal urethral meatus; normal appearing vaginal mucosa and cervix.  No abnormal discharge noted.  Normal uterine size, no other palpable masses, no uterine or adnexal tenderness. Performed in the presence of a chaperone"} ? ?Labs and Imaging ?No results found for this or any previous visit (from the past 168 hour(s)). ?No results found.  ?Assessment and Plan:  ? 1. Vulvar irritation ? ? ? ? ?Diagnoses and all orders for this visit: ? ?Vulvar irritation ? ? ? ?Routine preventative health maintenance measures emphasized. ?Please refer to After Visit Summary for other counseling recommendations.  ? ?No follow-ups on file. ? ?Radene Gunning, MD, FACOG ?Obstetrician Social research officer, government, Faculty Practice ?[facility] [place] ? ? ? ? ? ?

## 2021-08-03 ENCOUNTER — Ambulatory Visit: Payer: 59 | Admitting: Obstetrics and Gynecology

## 2021-08-03 DIAGNOSIS — N9089 Other specified noninflammatory disorders of vulva and perineum: Secondary | ICD-10-CM

## 2021-12-14 IMAGING — MG MM DIGITAL SCREENING BILAT W/ TOMO AND CAD
8 series · 8 of 24 positions shown · non-contrast
Comparison: Previous exam(s).

CLINICAL DATA: Screening.

EXAM:
DIGITAL SCREENING BILATERAL MAMMOGRAM WITH TOMOSYNTHESIS AND CAD
TECHNIQUE: Bilateral screening digital craniocaudal and mediolateral oblique
mammograms were obtained. Bilateral screening digital breast
tomosynthesis was performed. The images were evaluated with
computer-aided detection.

[R MLO synth-2D]
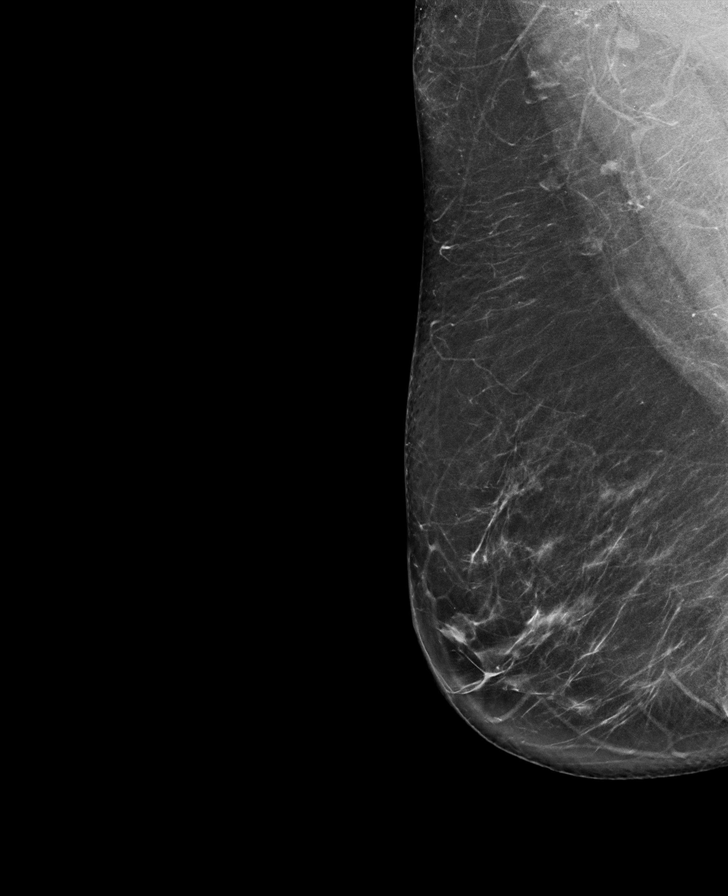

[L CC synth-2D]
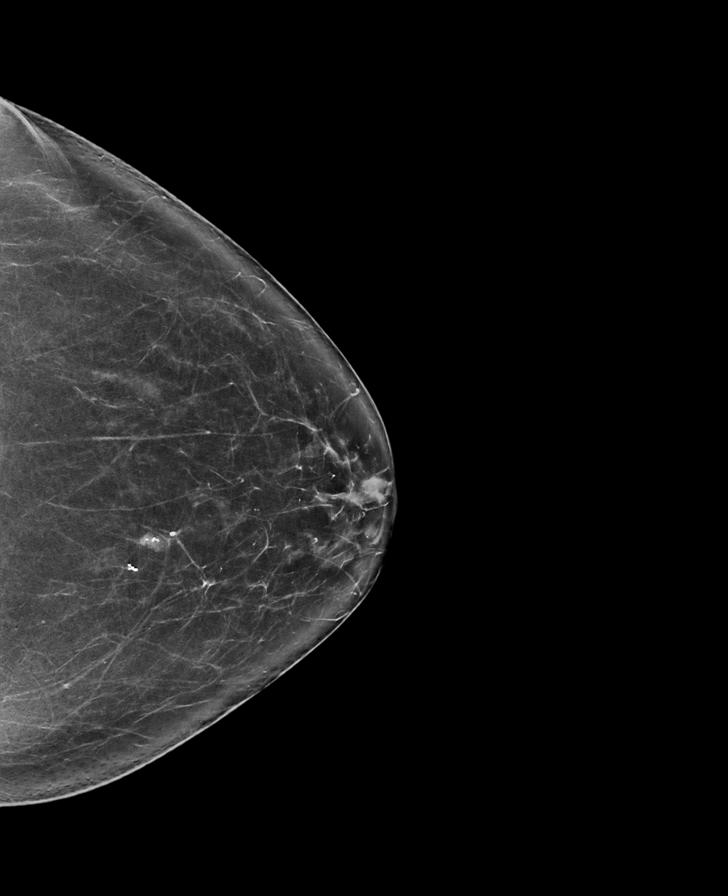

[R CC synth-2D]
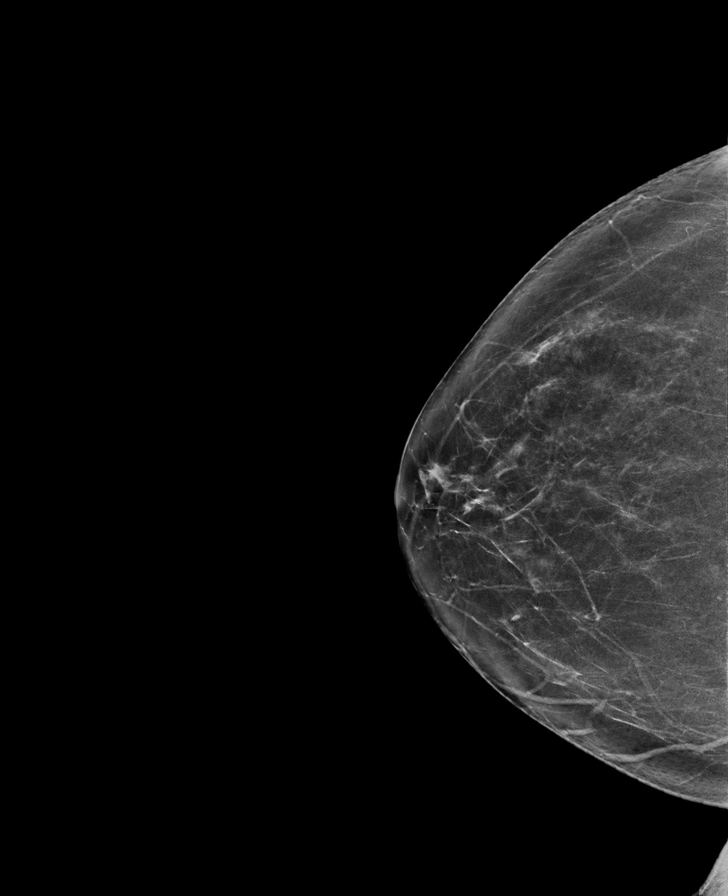

[L MLO synth-2D]
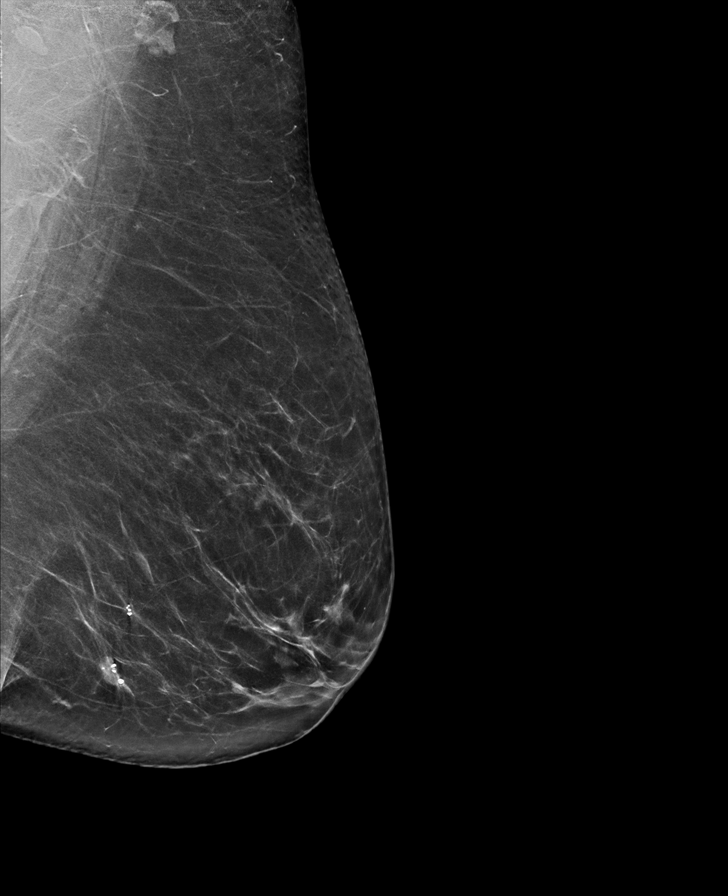

[L MLO tomo · tomo slice 39/77.0]
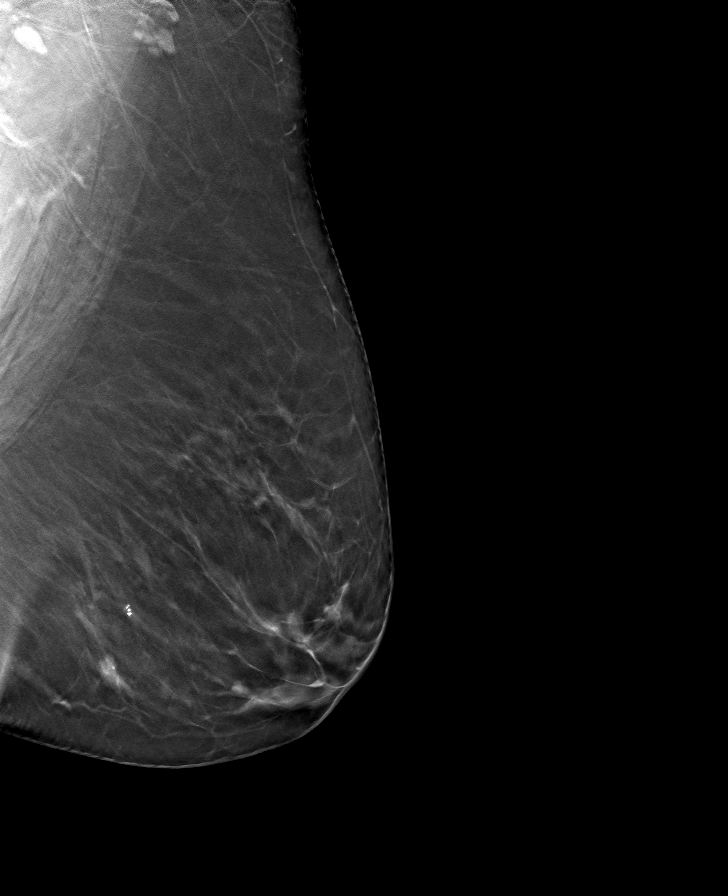

[L CC tomo · tomo slice 37/74.0]
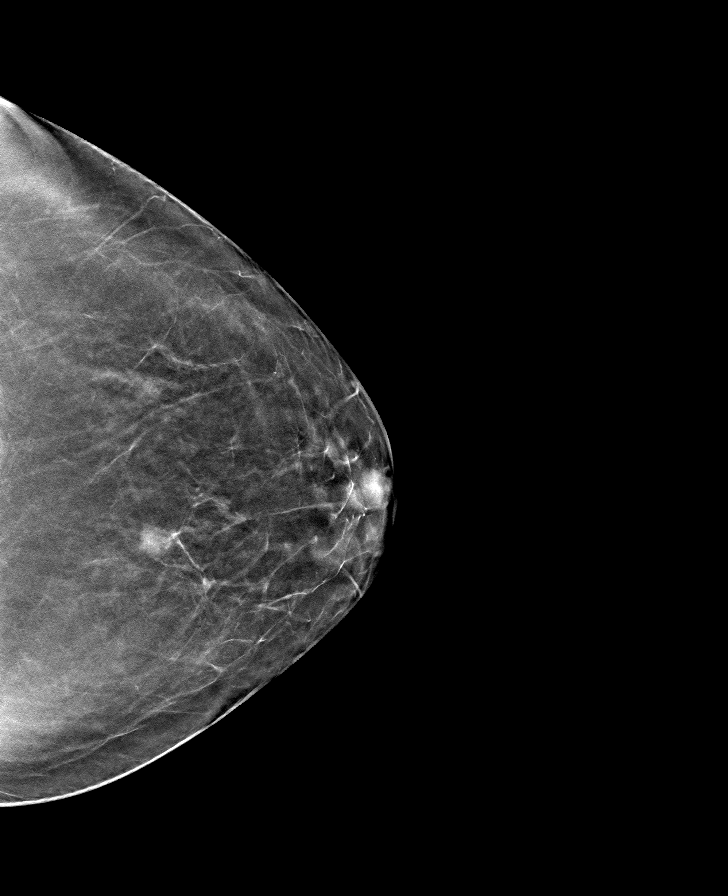

[R CC tomo · tomo slice 36/71.0]
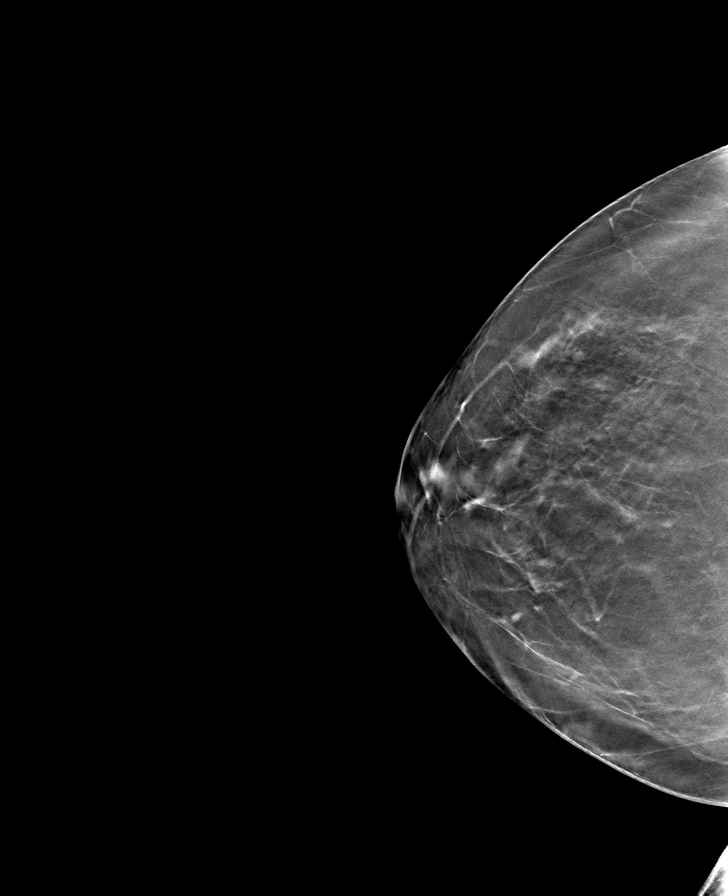

[R MLO tomo · tomo slice 39/77.0]
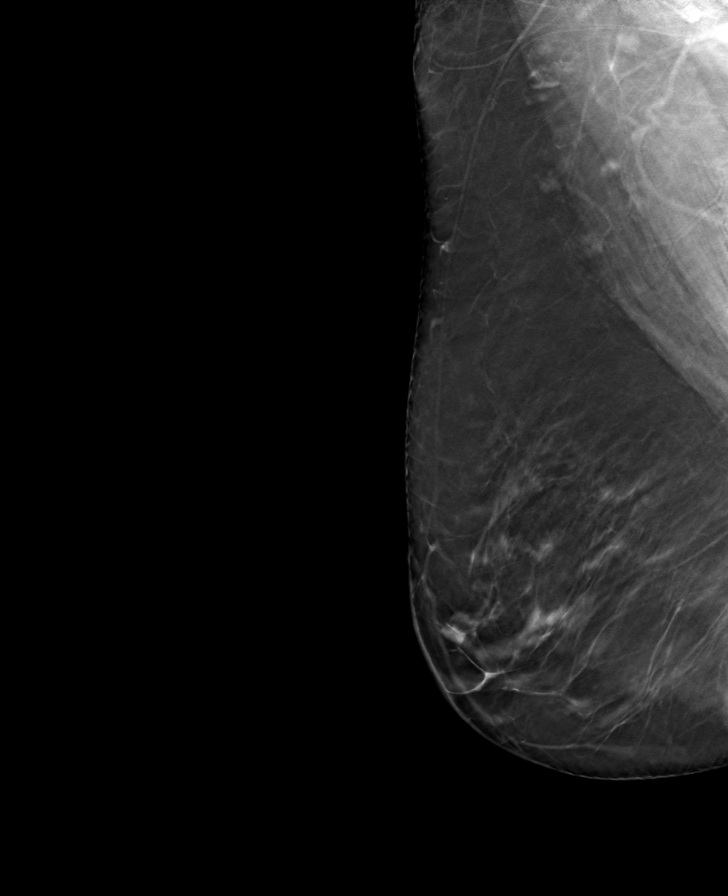

[8 of 24 positions shown; findings below may reference images not displayed]

ACR Breast Density Category b: There are scattered areas of
fibroglandular density.
FINDINGS: There are no findings suspicious for malignancy. The images were
evaluated with computer-aided detection.
IMPRESSION: No mammographic evidence of malignancy. A result letter of this
screening mammogram will be mailed directly to the patient.

RECOMMENDATION:
Screening mammogram in one year. (Code:WJ-I-BG6)

BI-RADS CATEGORY  1: Negative.

## 2021-12-28 LAB — MM 3D SCREENING MAMMOGRAM BILATERAL BREAST: HM Mammogram: NORMAL (ref 0–4)

## 2022-07-15 NOTE — Progress Notes (Signed)
Pt rescheduled

## 2022-07-19 ENCOUNTER — Ambulatory Visit: Payer: Commercial Managed Care - HMO | Admitting: Obstetrics and Gynecology

## 2022-07-19 DIAGNOSIS — Z01419 Encounter for gynecological examination (general) (routine) without abnormal findings: Secondary | ICD-10-CM

## 2022-07-25 NOTE — Progress Notes (Unsigned)
   ANNUAL EXAM Patient name: Shelly Haley MRN [id number]  Date of birth: [birth year] Chief Complaint:   No chief complaint on file.  History of Present Illness:   Shelly Haley is a 62 y.o. G2P2000 female being seen today for a routine annual exam.   Current complaints: ***  No LMP recorded. Patient is postmenopausal.   Last MXR: 09/07/2020 Last Pap/Pap History:  09/2020: LSIL 04/2021: Colpo - Bx with CIN1   Health Maintenance Due  Topic Date Due   COVID-19 Vaccine (1) Never done   HIV Screening  Never done   Hepatitis C Screening  Never done   DTaP/Tdap/Td (1 - Tdap) Never done   INFLUENZA VACCINE  12/12/2021    Review of Systems:   Pertinent items are noted in HPI Denies any headaches, blurred vision, fatigue, shortness of breath, chest pain, abdominal pain, abnormal vaginal discharge/itching/odor/irritation, problems with periods, bowel movements, urination, or intercourse unless otherwise stated above. *** Pertinent History Reviewed:  Reviewed past medical,surgical, social and family history.  Reviewed problem list, medications and allergies. Physical Assessment:  There were no vitals filed for this visit.There is no height or weight on file to calculate BMI.   Physical Examination:  General appearance - well appearing, and in no distress Mental status - alert, oriented to person, place, and time Psych:  She has a normal mood and affect Skin - warm and dry, normal color, no suspicious lesions noted Chest - effort normal Heart - normal rate  Breasts - breasts appear normal, no suspicious masses, no skin or nipple changes or axillary nodes Abdomen - soft, nontender, nondistended, no masses or organomegaly Pelvic -  VULVA: normal appearing vulva with no masses, tenderness or lesions  VAGINA: normal appearing vagina with normal color and discharge, no lesions  CERVIX: normal appearing cervix without discharge or lesions, no CMT UTERUS: uterus is felt to be normal  size, shape, consistency and nontender  ADNEXA: No adnexal masses or tenderness noted. Extremities:  No swelling or varicosities noted  Chaperone present for exam  No results found for this or any previous visit (from the past 24 hour(s)).  Assessment & Plan:  Diagnoses and all orders for this visit:  Encounter for annual routine gynecological examination  - Cervical cancer screening: Discussed guidelines. Pap with HPV done - Breast Health: Encouraged self breast awareness/SBE. Discussed limits of clinical breast exam for detecting breast cancer. Discussed importance of annual MXR. Rx given for MXR - Climacteric/Sexual health: Reviewed typical and atypical symptoms of menopause/peri-menopause. Discussed PMB and to call if any amount of spotting.  - Colonoscopy:  2016 at [facility] - F/U 12 months and prn      No orders of the defined types were placed in this encounter.   Meds: No orders of the defined types were placed in this encounter.   Follow-up: No follow-ups on file.  Radene Gunning, MD 07/25/2022 11:33 AM

## 2022-07-26 ENCOUNTER — Ambulatory Visit (INDEPENDENT_AMBULATORY_CARE_PROVIDER_SITE_OTHER): Payer: Commercial Managed Care - HMO | Admitting: Obstetrics and Gynecology

## 2022-07-26 ENCOUNTER — Encounter: Payer: Self-pay | Admitting: Obstetrics and Gynecology

## 2022-07-26 ENCOUNTER — Other Ambulatory Visit (HOSPITAL_COMMUNITY)
Admission: RE | Admit: 2022-07-26 | Discharge: 2022-07-26 | Disposition: A | Discharge status: Home / Self Care | Payer: Commercial Managed Care - HMO | Source: Ambulatory Visit | Attending: Obstetrics and Gynecology | Admitting: Obstetrics and Gynecology

## 2022-07-26 VITALS — BP 153/93 | HR 92 | Ht 68.0 in | Wt 161.0 lb

## 2022-07-26 DIAGNOSIS — Z01419 Encounter for gynecological examination (general) (routine) without abnormal findings: Secondary | ICD-10-CM | POA: Insufficient documentation

## 2022-07-31 LAB — CYTOLOGY - PAP
Comment: NEGATIVE
Diagnosis: UNDETERMINED — AB
High risk HPV: NEGATIVE

## 2023-01-24 ENCOUNTER — Ambulatory Visit (INDEPENDENT_AMBULATORY_CARE_PROVIDER_SITE_OTHER): Payer: Commercial Managed Care - HMO

## 2023-01-24 DIAGNOSIS — Z01419 Encounter for gynecological examination (general) (routine) without abnormal findings: Secondary | ICD-10-CM

## 2023-01-24 DIAGNOSIS — Z1231 Encounter for screening mammogram for malignant neoplasm of breast: Secondary | ICD-10-CM | POA: Diagnosis not present

## 2023-04-03 DIAGNOSIS — D802 Selective deficiency of immunoglobulin A [IgA]: Secondary | ICD-10-CM | POA: Insufficient documentation

## 2023-12-12 ENCOUNTER — Ambulatory Visit: Payer: Self-pay | Admitting: Obstetrics and Gynecology

## 2023-12-12 ENCOUNTER — Encounter: Payer: Self-pay | Admitting: Obstetrics and Gynecology

## 2023-12-12 ENCOUNTER — Other Ambulatory Visit (HOSPITAL_COMMUNITY)
Admission: RE | Admit: 2023-12-12 | Discharge: 2023-12-12 | Disposition: A | Discharge status: Home / Self Care | Source: Ambulatory Visit | Attending: Obstetrics and Gynecology | Admitting: Obstetrics and Gynecology

## 2023-12-12 VITALS — BP 155/78 | HR 72 | Ht 68.0 in | Wt 162.0 lb

## 2023-12-12 DIAGNOSIS — Z01419 Encounter for gynecological examination (general) (routine) without abnormal findings: Secondary | ICD-10-CM | POA: Diagnosis present

## 2023-12-12 DIAGNOSIS — R87619 Unspecified abnormal cytological findings in specimens from cervix uteri: Secondary | ICD-10-CM

## 2023-12-12 NOTE — Progress Notes (Signed)
   ANNUAL EXAM Patient name: Shelly Haley MRN [id number]  Date of birth: 1960-10-10 Chief Complaint:   No chief complaint on file.  History of Present Illness:   Shelly Haley is a 63 y.o. G28P2002 female being seen today for a routine annual exam.   Current complaints: None.   No LMP recorded. Patient is postmenopausal.  Last MXR: 01/2023 Last Pap/Pap History:  09/2020: LSIL 04/2021: Colpo - Bx with CIN1 07/2022: ASCUS/HPV neg   Health Maintenance Due  Topic Date Due   COVID-19 Vaccine (1) Never done   HIV Screening  Never done   Hepatitis C Screening  Never done   DTaP/Tdap/Td (1 - Tdap) Never done   Pneumococcal Vaccine 54-35 Years old (1 of 2 - PCV) Never done    Review of Systems:   Pertinent items are noted in HPI Denies any headaches, blurred vision, fatigue, shortness of breath, chest pain, abdominal pain, abnormal vaginal discharge/itching/odor/irritation, problems with periods, bowel movements, urination, or intercourse unless otherwise stated above.  Pertinent History Reviewed:  Reviewed past medical,surgical, social and family history.  Reviewed problem list, medications and allergies. Physical Assessment:   There were no vitals filed for this visit. There is no height or weight on file to calculate BMI.   Physical Examination:  General appearance - well appearing, and in no distress Mental status - alert, oriented to person, place, and time Psych:  She has a normal mood and affect Skin - warm and dry, normal color, no suspicious lesions noted Chest - effort normal Heart - normal rate  Breasts - breasts appear normal, no suspicious masses, no skin or nipple changes or axillary nodes Abdomen - soft, nontender, nondistended, no masses or organomegaly Pelvic -  VULVA: normal appearing vulva with no masses, tenderness or lesions  VAGINA: normal appearing vagina with normal color and discharge, no lesions  CERVIX: normal appearing cervix without discharge or  lesions, no CMT UTERUS: uterus is felt to be normal size, shape, consistency and nontender  ADNEXA: No adnexal masses or tenderness noted. Extremities:  No swelling or varicosities noted  Chaperone present for exam  No results found for this or any previous visit (from the past 24 hours).  Assessment & Plan:  Diagnoses and all orders for this visit:  Encounter for annual routine gynecological examination  Abnormal cervical Papanicolaou smear, unspecified abnormal pap finding  - Cervical cancer screening: Discussed guidelines. Pap with HPV done - Breast Health: Encouraged self breast awareness/SBE. Discussed limits of clinical breast exam for detecting breast cancer. Discussed importance of annual MXR. MXR was 01/2023.  - Climacteric/Sexual health: Reviewed typical and atypical symptoms of menopause/peri-menopause. Discussed PMB and to call if any amount of spotting.  - Colonoscopy: 2016 at [facility] - F/U 12 months and prn      No orders of the defined types were placed in this encounter.   Meds: No orders of the defined types were placed in this encounter.   Follow-up: No follow-ups on file.  Vina Solian, MD 12/12/2023 11:55 AM

## 2023-12-19 LAB — CYTOLOGY - PAP
Comment: NEGATIVE
Diagnosis: REACTIVE
High risk HPV: NEGATIVE

## 2023-12-20 ENCOUNTER — Ambulatory Visit: Payer: Self-pay | Admitting: Obstetrics and Gynecology

## 2024-02-13 ENCOUNTER — Other Ambulatory Visit: Payer: Self-pay | Admitting: Obstetrics and Gynecology

## 2024-02-13 ENCOUNTER — Ambulatory Visit (INDEPENDENT_AMBULATORY_CARE_PROVIDER_SITE_OTHER)

## 2024-02-13 DIAGNOSIS — R87619 Unspecified abnormal cytological findings in specimens from cervix uteri: Secondary | ICD-10-CM

## 2024-02-13 DIAGNOSIS — Z1231 Encounter for screening mammogram for malignant neoplasm of breast: Secondary | ICD-10-CM

## 2024-02-13 DIAGNOSIS — Z01419 Encounter for gynecological examination (general) (routine) without abnormal findings: Secondary | ICD-10-CM
